# Patient Record
Sex: Male | Born: 1975 | Race: White | Hispanic: No | Marital: Single | Smoking: Current some day smoker
Health system: Southern US, Community
[De-identification: ages and names within clinical notes are randomized; demographics above are authoritative.]

---

## 2007-01-20 ENCOUNTER — Emergency Department: Payer: Self-pay | Admitting: Emergency Medicine

## 2007-09-07 ENCOUNTER — Emergency Department: Payer: Self-pay | Admitting: Emergency Medicine

## 2007-12-27 ENCOUNTER — Emergency Department: Payer: Self-pay | Admitting: Emergency Medicine

## 2008-02-09 ENCOUNTER — Emergency Department: Payer: Self-pay | Admitting: Unknown Physician Specialty

## 2018-02-09 ENCOUNTER — Emergency Department: Payer: Self-pay

## 2018-02-09 ENCOUNTER — Emergency Department
Admission: EM | Admit: 2018-02-09 | Discharge: 2018-02-09 | Disposition: A | Discharge status: Home / Self Care | Payer: Self-pay | Attending: Emergency Medicine | Admitting: Emergency Medicine

## 2018-02-09 ENCOUNTER — Other Ambulatory Visit: Payer: Self-pay

## 2018-02-09 ENCOUNTER — Encounter: Payer: Self-pay | Admitting: Emergency Medicine

## 2018-02-09 DIAGNOSIS — J102 Influenza due to other identified influenza virus with gastrointestinal manifestations: Secondary | ICD-10-CM | POA: Insufficient documentation

## 2018-02-09 DIAGNOSIS — N3000 Acute cystitis without hematuria: Secondary | ICD-10-CM | POA: Insufficient documentation

## 2018-02-09 DIAGNOSIS — F17210 Nicotine dependence, cigarettes, uncomplicated: Secondary | ICD-10-CM | POA: Insufficient documentation

## 2018-02-09 DIAGNOSIS — R05 Cough: Secondary | ICD-10-CM | POA: Insufficient documentation

## 2018-02-09 DIAGNOSIS — J101 Influenza due to other identified influenza virus with other respiratory manifestations: Secondary | ICD-10-CM

## 2018-02-09 LAB — URINALYSIS, COMPLETE (UACMP) WITH MICROSCOPIC
Bilirubin Urine: NEGATIVE
Glucose, UA: NEGATIVE mg/dL
Hgb urine dipstick: NEGATIVE
Ketones, ur: 20 mg/dL — AB
Leukocytes, UA: NEGATIVE
Nitrite: NEGATIVE
PH: 5 (ref 5.0–8.0)
Protein, ur: 100 mg/dL — AB
SPECIFIC GRAVITY, URINE: 1.03 (ref 1.005–1.030)

## 2018-02-09 LAB — LIPASE, BLOOD: LIPASE: 39 U/L (ref 11–51)

## 2018-02-09 LAB — CBC
HCT: 51.5 % (ref 40.0–52.0)
Hemoglobin: 17.9 g/dL (ref 13.0–18.0)
MCH: 30.8 pg (ref 26.0–34.0)
MCHC: 34.8 g/dL (ref 32.0–36.0)
MCV: 88.5 fL (ref 80.0–100.0)
PLATELETS: 211 10*3/uL (ref 150–440)
RBC: 5.82 MIL/uL (ref 4.40–5.90)
RDW: 12.9 % (ref 11.5–14.5)
WBC: 7.9 10*3/uL (ref 3.8–10.6)

## 2018-02-09 LAB — COMPREHENSIVE METABOLIC PANEL
ALT: 33 U/L (ref 17–63)
AST: 38 U/L (ref 15–41)
Albumin: 4.3 g/dL (ref 3.5–5.0)
Alkaline Phosphatase: 56 U/L (ref 38–126)
Anion gap: 11 (ref 5–15)
BUN: 21 mg/dL — AB (ref 6–20)
CHLORIDE: 101 mmol/L (ref 101–111)
CO2: 25 mmol/L (ref 22–32)
CREATININE: 1.17 mg/dL (ref 0.61–1.24)
Calcium: 9 mg/dL (ref 8.9–10.3)
GFR calc Af Amer: >60 mL/min (ref >60)
GFR calc non Af Amer: >60 mL/min (ref >60)
GLUCOSE: 124 mg/dL — AB (ref 65–99)
Potassium: 3.8 mmol/L (ref 3.5–5.1)
SODIUM: 137 mmol/L (ref 135–145)
Total Bilirubin: 0.6 mg/dL (ref 0.3–1.2)
Total Protein: 8.6 g/dL — ABNORMAL HIGH (ref 6.5–8.1)

## 2018-02-09 LAB — INFLUENZA PANEL BY PCR (TYPE A & B)
INFLAPCR: POSITIVE — AB
INFLBPCR: NEGATIVE

## 2018-02-09 MED ORDER — SODIUM CHLORIDE 0.9 % IV BOLUS (SEPSIS)
1000.0000 mL | Freq: Once | INTRAVENOUS | Status: AC
  Administered 2018-02-09: 1000 mL via INTRAVENOUS

## 2018-02-09 MED ORDER — IPRATROPIUM-ALBUTEROL 0.5-2.5 (3) MG/3ML IN SOLN
3.0000 mL | Freq: Once | RESPIRATORY_TRACT | Status: AC
  Administered 2018-02-09: 3 mL via RESPIRATORY_TRACT
  Filled 2018-02-09: qty 3

## 2018-02-09 MED ORDER — ACETAMINOPHEN 500 MG PO TABS
1000.0000 mg | ORAL_TABLET | Freq: Once | ORAL | Status: AC
  Administered 2018-02-09: 1000 mg via ORAL

## 2018-02-09 MED ORDER — ACETAMINOPHEN 500 MG PO TABS
ORAL_TABLET | ORAL | Status: DC
Start: 2018-02-09 — End: 2018-02-09
  Filled 2018-02-09: qty 2

## 2018-02-09 MED ORDER — SULFAMETHOXAZOLE-TRIMETHOPRIM 800-160 MG PO TABS
ORAL_TABLET | ORAL | Status: AC
  Filled 2018-02-09: qty 1

## 2018-02-09 MED ORDER — ONDANSETRON 4 MG PO TBDP: 4.0000 mg | ORAL_TABLET | Freq: Three times a day (TID) | ORAL | 0 refills | Status: DC | PRN

## 2018-02-09 MED ORDER — SULFAMETHOXAZOLE-TRIMETHOPRIM 800-160 MG PO TABS: 1.0000 | ORAL_TABLET | Freq: Two times a day (BID) | ORAL | 0 refills | Status: DC

## 2018-02-09 MED ORDER — SODIUM CHLORIDE 0.9 % IV BOLUS (SEPSIS)
1000.0000 mL | Freq: Once | INTRAVENOUS | Status: AC
  Administered 2018-02-09: 500 mL via INTRAVENOUS

## 2018-02-09 MED ORDER — SULFAMETHOXAZOLE-TRIMETHOPRIM 800-160 MG PO TABS
1.0000 | ORAL_TABLET | Freq: Once | ORAL | Status: AC
  Administered 2018-02-09: 1 via ORAL

## 2018-02-09 MED ORDER — ONDANSETRON HCL 4 MG/2ML IJ SOLN
4.0000 mg | Freq: Once | INTRAMUSCULAR | Status: AC
  Administered 2018-02-09: 4 mg via INTRAVENOUS
  Filled 2018-02-09: qty 2

## 2018-02-09 MED ORDER — PREDNISONE 20 MG PO TABS
60.0000 mg | ORAL_TABLET | Freq: Once | ORAL | Status: AC
  Administered 2018-02-09: 60 mg via ORAL
  Filled 2018-02-09: qty 3

## 2018-02-09 NOTE — ED Notes (Signed)
Clear liquids given to pt at this time, will continue to monitor

## 2018-02-09 NOTE — ED Triage Notes (Signed)
Pt states vomiting and diarrhea since Saturday, has been unable to keep anything down in a few days, appears in no distress, fever up and down per patient, last vomiting was prior to arrival this am.

## 2018-02-09 NOTE — ED Provider Notes (Signed)
[facility] Emergency Department Provider Note  ____________________________________________  Time seen: Approximately 4:21 PM  I have reviewed the triage vital signs and the nursing notes.   HISTORY  Chief Complaint Emesis and Diarrhea    HPI Geoffrey Moore is a 42 y.o. male going tobacco abuse presenting with fever, cough, nausea vomiting and diarrhea, body aches.  The patient reports that 5 days ago, he began to have 1 daily episode of nonbloody diarrhea, which resolved 2 days ago.  Simultaneously, he began to have a cough productive of thick phlegm, and 2-3 daily episodes of vomiting.  He has had fever as high as 101.0; his last fever was 2-3 days ago.  He denies any shortness of breath, lightheadedness, syncope, chest pain.  He did not get his influenza vaccination this year.  He has not had any sick contacts or travel outside the [place].  History reviewed. No pertinent past medical history.  There are no active problems to display for this patient.   History reviewed. No pertinent surgical history.    Allergies Patient has no known allergies.  No family history on file.  Social History Social History   Tobacco Use  . Smoking status: Current Some Day Smoker    Types: Cigarettes  . Smokeless tobacco: Never Used  Substance Use Topics  . Alcohol use: No    Frequency: Never  . Drug use: Not on file    Review of Systems Constitutional: Positive fever, general malaise, and myalgias. Eyes: No visual changes.  No eye discharge. ENT: No sore throat. No congestion or rhinorrhea.  No ear pain. Cardiovascular: Denies chest pain. Denies palpitations. Respiratory: Denies shortness of breath.  Positive productive cough. Gastrointestinal: No abdominal pain.  +nausea, +vomiting.  +diarrhea, now resolved.  No constipation. Genitourinary: Negative for dysuria. Musculoskeletal: Negative for back pain. Skin: Negative for rash. Neurological:  Negative for headaches. No focal numbness, tingling or weakness.     ____________________________________________   PHYSICAL EXAM:  VITAL SIGNS: ED Triage Vitals  Enc Vitals Group     BP 02/09/18 1144 127/70     Pulse Rate 02/09/18 1144 (!) 109     Resp 02/09/18 1144 18     Temp 02/09/18 1144 99.2 F (37.3 C)     Temp Source 02/09/18 1144 Oral     SpO2 02/09/18 1144 96 %     Weight 02/09/18 1145 140 lb (63.5 kg)     Height 02/09/18 1145 5\' 9"  (1.753 m)     Head Circumference --      Peak Flow --      Pain Score 02/09/18 1524 6     Pain Loc --      Pain Edu? --      Excl. in GC? --     Constitutional: Alert and oriented. Answers questions appropriately.  The patient is well-appearing and nontoxic Eyes: Conjunctivae are normal.  EOMI. No scleral icterus.  No eye discharge. Head: Atraumatic. Nose: No congestion/rhinnorhea. Mouth/Throat: Mucous membranes are moist.  Neck: No stridor.  Supple.  No JVD.  No meningismus. Cardiovascular: Fast rate, regular rhythm. No murmurs, rubs or gallops.  Respiratory: Normal respiratory effort.  No accessory muscle use or retractions.  The patient has end expiratory wheezing without rales or rhonchi.  Gastrointestinal: Soft, nontender and nondistended.  No guarding or rebound.  No peritoneal signs. Musculoskeletal: No LE edema. No ttp in the calves or palpable cords.  Negative Homan's sign. Neurologic:  A&Ox3.  Speech is clear.  Face and smile are symmetric.  EOMI.  Moves all extremities well. Skin:  Skin is warm, dry and intact. No rash noted. Psychiatric: Mood and affect are normal. Speech and behavior are normal.  Normal judgement  ____________________________________________   LABS (all labs ordered are listed, but only abnormal results are displayed)  Labs Reviewed  COMPREHENSIVE METABOLIC PANEL - Abnormal; Notable for the following components:      Result Value   Glucose, Bld 124 (*)    BUN 21 (*)    Total Protein 8.6 (*)     All other components within normal limits  URINALYSIS, COMPLETE (UACMP) WITH MICROSCOPIC - Abnormal; Notable for the following components:   Color, Urine AMBER (*)    APPearance HAZY (*)    Ketones, ur 20 (*)    Protein, ur 100 (*)    Bacteria, UA FEW (*)    Squamous Epithelial / LPF 0-5 (*)    All other components within normal limits  INFLUENZA PANEL BY PCR (TYPE A & B) - Abnormal; Notable for the following components:   Influenza A By PCR POSITIVE (*)    All other components within normal limits  URINE CULTURE  LIPASE, BLOOD  CBC   ____________________________________________  EKG  ED ECG REPORT I, Rockne Menghini, the attending physician, personally viewed and interpreted this ECG.   Date: 02/09/2018  EKG Time: 1649  Rate: 82  Rhythm: normal sinus rhythm  Axis: normal  Intervals:none  ST&T Change: No STEMI  ____________________________________________  RADIOLOGY  Dg Chest 2 View  Result Date: 02/09/2018 CLINICAL DATA:  Vomiting and diarrhea over the last 5 days. EXAM: CHEST  2 VIEW COMPARISON:  None. FINDINGS: Heart size is normal. Mediastinal shadows are normal. The lungs are clear. No bronchial thickening. No infiltrate, mass, effusion or collapse. Pulmonary vascularity is normal. No bony abnormality. IMPRESSION: Normal chest Electronically Signed   By: Paulina Fusi M.D.   On: 02/09/2018 16:37    ____________________________________________   PROCEDURES  Procedure(s) performed: None  Procedures  Critical Care performed: No ____________________________________________   INITIAL IMPRESSION / ASSESSMENT AND PLAN / ED COURSE  Pertinent labs & imaging results that were available during my care of the patient were reviewed by me and considered in my medical decision making (see chart for details).  42 y.o. smoker presenting with 5 days of nausea vomiting and diarrhea, cough, fever, and general malaise.  Overall, the patient is mildly tachycardic which  may be from dehydration.  He will receive intravenous fluids for this.  We will also get an EKG to evaluate for any evidence of arrhythmia including pericarditis although he is not having any chest pain.  Most likely etiology of the patient's symptoms is influenza or flulike illness.  His laboratory studies are reassuring with a normal white blood cell count, and a normal potassium as well as sodium.  His lipase is also normal.  We will plan to treat the patient symptomatically, get a chest x-ray to rule out pneumonia, and reevaluate him for final disposition.  ----------------------------------------- 6:48 PM on 02/09/2018 -----------------------------------------  At this time, the patient continues to be hemodynamically stable and he is feeling much better.  He is able to tolerate liquids without difficulty.  His heart rate has also normalized.  He is positive for influenza A, and also has bacteriuria with elevated white blood cells, so we will treat him for urinary tract infection.  At this time, the patient is stable for discharge home.  I have discussed close  follow-up as well as return precautions with the patient ____________________________________________  FINAL CLINICAL IMPRESSION(S) / ED DIAGNOSES  Final diagnoses:  Acute cystitis without hematuria  Influenza A         NEW MEDICATIONS STARTED DURING THIS VISIT:  New Prescriptions   ONDANSETRON (ZOFRAN ODT) 4 MG DISINTEGRATING TABLET    Take 1 tablet (4 mg total) by mouth every 8 (eight) hours as needed for nausea or vomiting.   SULFAMETHOXAZOLE-TRIMETHOPRIM (BACTRIM DS,SEPTRA DS) 800-160 MG TABLET    Take 1 tablet by mouth 2 (two) times daily.      Rockne Menghini, MD 02/09/18 1850

## 2018-02-09 NOTE — Discharge Instructions (Addendum)
Drink plenty of fluids stay well-hydrated.  You may take Tylenol or Motrin for fever pain.  Zofran is for nausea and vomiting.  Please take the entire course of antibiotics for your urinary tract infection, even if you are feeling better.  Please make an appointment with your primary care physician for follow-up; your doctor can also follow-up the results of your urine culture.  Please make sure you practice frequent and good handwashing to prevent the spread of infection.  Return to the emergency department if you develop severe pain, lightheadedness or fainting, inability to keep down fluids, or any other symptoms concerning to you.

## 2018-02-11 LAB — URINE CULTURE: CULTURE: NO GROWTH

## 2018-02-14 ENCOUNTER — Emergency Department
Admission: EM | Admit: 2018-02-14 | Discharge: 2018-02-14 | Disposition: A | Discharge status: Home / Self Care | Payer: Self-pay | Attending: Emergency Medicine | Admitting: Emergency Medicine

## 2018-02-14 ENCOUNTER — Emergency Department: Payer: Self-pay

## 2018-02-14 ENCOUNTER — Encounter: Payer: Self-pay | Admitting: Emergency Medicine

## 2018-02-14 DIAGNOSIS — J209 Acute bronchitis, unspecified: Secondary | ICD-10-CM | POA: Insufficient documentation

## 2018-02-14 DIAGNOSIS — Z7722 Contact with and (suspected) exposure to environmental tobacco smoke (acute) (chronic): Secondary | ICD-10-CM | POA: Insufficient documentation

## 2018-02-14 MED ORDER — BENZONATATE 100 MG PO CAPS: 100.0000 mg | ORAL_CAPSULE | Freq: Three times a day (TID) | ORAL | 0 refills | Status: AC | PRN

## 2018-02-14 MED ORDER — PREDNISONE 50 MG PO TABS: ORAL_TABLET | ORAL | 0 refills | Status: DC

## 2018-02-14 MED ORDER — AZITHROMYCIN 250 MG PO TABS: ORAL_TABLET | ORAL | 0 refills | Status: DC

## 2018-02-14 NOTE — ED Notes (Signed)
See triage note  Presents with cough and fever last pm  Was seen on Friday and dx'd with the flu  states he felt better couple of days ago  Then last pm sx's returned

## 2018-02-14 NOTE — ED Triage Notes (Signed)
Pt to ED with c/o of chest congestion and cough. Pt dx with flu last Friday. Pt states congestion increased and SOB.

## 2018-02-14 NOTE — ED Provider Notes (Signed)
[facility] Emergency Department Provider Note  ____________________________________________  Time seen: Approximately 3:58 PM  I have reviewed the triage vital signs and the nursing notes.   HISTORY  Chief Complaint Cough    HPI Geoffrey Moore is a 42 y.o. male presents to the emergency department with cough productive for green sputum production, shortness of breath and fatigue that started approximately 1 week after patient was diagnosed with influenza A.  He denies chest pain and chest tightness.  No prior history of asthma.  Patient reports a [place] [address] F assessed orally on Monday.  No alleviating measures have been attempted.  History reviewed. No pertinent past medical history.  There are no active problems to display for this patient.   History reviewed. No pertinent surgical history.  Prior to Admission medications   Medication Sig Start Date End Date Taking? Authorizing Provider  azithromycin (ZITHROMAX Z-PAK) 250 MG tablet Take 2 tablets the first day. Take one tablet by mouth on days 2-5. 02/14/18   Orvil FeilWoods, Jaclyn M, PA-C  benzonatate (TESSALON PERLES) 100 MG capsule Take 1 capsule (100 mg total) by mouth 3 (three) times daily as needed for up to 7 days for cough. 02/14/18 02/21/18  Orvil FeilWoods, Jaclyn M, PA-C  ondansetron (ZOFRAN ODT) 4 MG disintegrating tablet Take 1 tablet (4 mg total) by mouth every 8 (eight) hours as needed for nausea or vomiting. 02/09/18   Rockne MenghiniNorman, Anne-Caroline, MD  predniSONE (DELTASONE) 50 MG tablet Take one tablet daily for the next five days. 02/14/18   Orvil FeilWoods, Jaclyn M, PA-C  sulfamethoxazole-trimethoprim (BACTRIM DS,SEPTRA DS) 800-160 MG tablet Take 1 tablet by mouth 2 (two) times daily. 02/09/18   Rockne MenghiniNorman, Anne-Caroline, MD    Allergies Patient has no known allergies.  History reviewed. No pertinent family history.  Social History Social History   Tobacco Use  . Smoking status: Current Some Day Smoker    Types: Cigarettes   . Smokeless tobacco: Never Used  Substance Use Topics  . Alcohol use: No    Frequency: Never  . Drug use: Not on file     Review of Systems  Constitutional: Patient has fever.  Eyes: No visual changes. No discharge ENT: No upper respiratory complaints. Cardiovascular: no chest pain. Respiratory: Patient has productive cough. No SOB. Gastrointestinal: No abdominal pain.  No nausea, no vomiting.  No diarrhea.  No constipation. Musculoskeletal: Negative for musculoskeletal pain. Skin: Negative for rash, abrasions, lacerations, ecchymosis. Neurological: Negative for headaches, focal weakness or numbness.   ____________________________________________   PHYSICAL EXAM:  VITAL SIGNS: ED Triage Vitals  Enc Vitals Group     BP 02/14/18 1449 133/77     Pulse --      Resp 02/14/18 1449 16     Temp 02/14/18 1449 98.2 F (36.8 C)     Temp Source 02/14/18 1449 Oral     SpO2 02/14/18 1449 96 %     Weight --      Height --      Head Circumference --      Peak Flow --      Pain Score 02/14/18 1453 0     Pain Loc --      Pain Edu? --      Excl. in GC? --       Constitutional: Alert and oriented. Patient is lying supine. Eyes: Conjunctivae are normal. PERRL. EOMI. Head: Atraumatic. ENT:      Ears: Tympanic membranes are mildly injected with mild effusion bilaterally.  Nose: No congestion/rhinnorhea.      Mouth/Throat: Mucous membranes are moist. Posterior pharynx is mildly erythematous.  Hematological/Lymphatic/Immunilogical: No cervical lymphadenopathy.  Cardiovascular: Normal rate, regular rhythm. Normal S1 and S2.  Good peripheral circulation. Respiratory: Normal respiratory effort without tachypnea or retractions. Lungs CTAB. Good air entry to the bases with no decreased or absent breath sounds. Gastrointestinal: Bowel sounds 4 quadrants. Soft and nontender to palpation. No guarding or rigidity. No palpable masses. No distention. No CVA tenderness. Musculoskeletal:  Full range of motion to all extremities. No gross deformities appreciated. Neurologic:  Normal speech and language. No gross focal neurologic deficits are appreciated.  Skin:  Skin is warm, dry and intact. No rash noted. Psychiatric: Mood and affect are normal. Speech and behavior are normal. Patient exhibits appropriate insight and judgement.     ____________________________________________   LABS (all labs ordered are listed, but only abnormal results are displayed)  Labs Reviewed - No data to display ____________________________________________  EKG   ____________________________________________  RADIOLOGY Geraldo Pitter, personally viewed and evaluated these images (plain radiographs) as part of my medical decision making, as well as reviewing the written report by the radiologist.  Dg Chest 2 View  Result Date: 02/14/2018 CLINICAL DATA:  Chest congestion and cough.  Shortness of breath. EXAM: CHEST  2 VIEW COMPARISON:  02/09/2018 FINDINGS: The heart size and mediastinal contours are within normal limits. Both lungs are clear. The visualized skeletal structures are unremarkable. IMPRESSION: No active cardiopulmonary disease. Electronically Signed   By: Elberta Fortis M.D.   On: 02/14/2018 15:22    ____________________________________________    PROCEDURES  Procedure(s) performed:    Procedures    Medications - No data to display   ____________________________________________   INITIAL IMPRESSION / ASSESSMENT AND PLAN / ED COURSE  Pertinent labs & imaging results that were available during my care of the patient were reviewed by me and considered in my medical decision making (see chart for details).  Review of the [place] CSRS was performed in accordance of the [facility] prior to dispensing any controlled drugs.     Assessment and plan Acute bronchitis Patient presents to the emergency department with cough productive for green sputum production and shortness of  breath that has occurred for approximately 1 week.  Differential diagnosis included community-acquired pneumonia, acute bronchitis and unspecified viral URI.  Chest x-ray revealed no consolidations or findings consistent with pneumonia.  Patient was treated empirically with azithromycin and prednisone and advised to follow-up with primary care.  Vital signs are reassuring prior to discharge.  All patient questions were answered.    ____________________________________________  FINAL CLINICAL IMPRESSION(S) / ED DIAGNOSES  Final diagnoses:  Acute bronchitis, unspecified organism      NEW MEDICATIONS STARTED DURING THIS VISIT:  ED Discharge Orders        Ordered    azithromycin (ZITHROMAX Z-PAK) 250 MG tablet     02/14/18 1547    predniSONE (DELTASONE) 50 MG tablet     02/14/18 1547    benzonatate (TESSALON PERLES) 100 MG capsule  3 times daily PRN     02/14/18 1547          This chart was dictated using voice recognition software/Dragon. Despite best efforts to proofread, errors can occur which can change the meaning. Any change was purely unintentional.    Orvil Feil, PA-C 02/14/18 1601    Rockne Menghini, MD 02/28/18 2240

## 2019-01-01 IMAGING — CR DG CHEST 2V
1 series · 2 of 2 positions shown · non-contrast
Comparison: 02/09/2018

CLINICAL DATA: Chest congestion and cough.  Shortness of breath.

EXAM:
CHEST  2 VIEW

[Series 1: dg chest 2 view · 0.14mm/px · 2 of 2 slices shown]
[im 1/2]
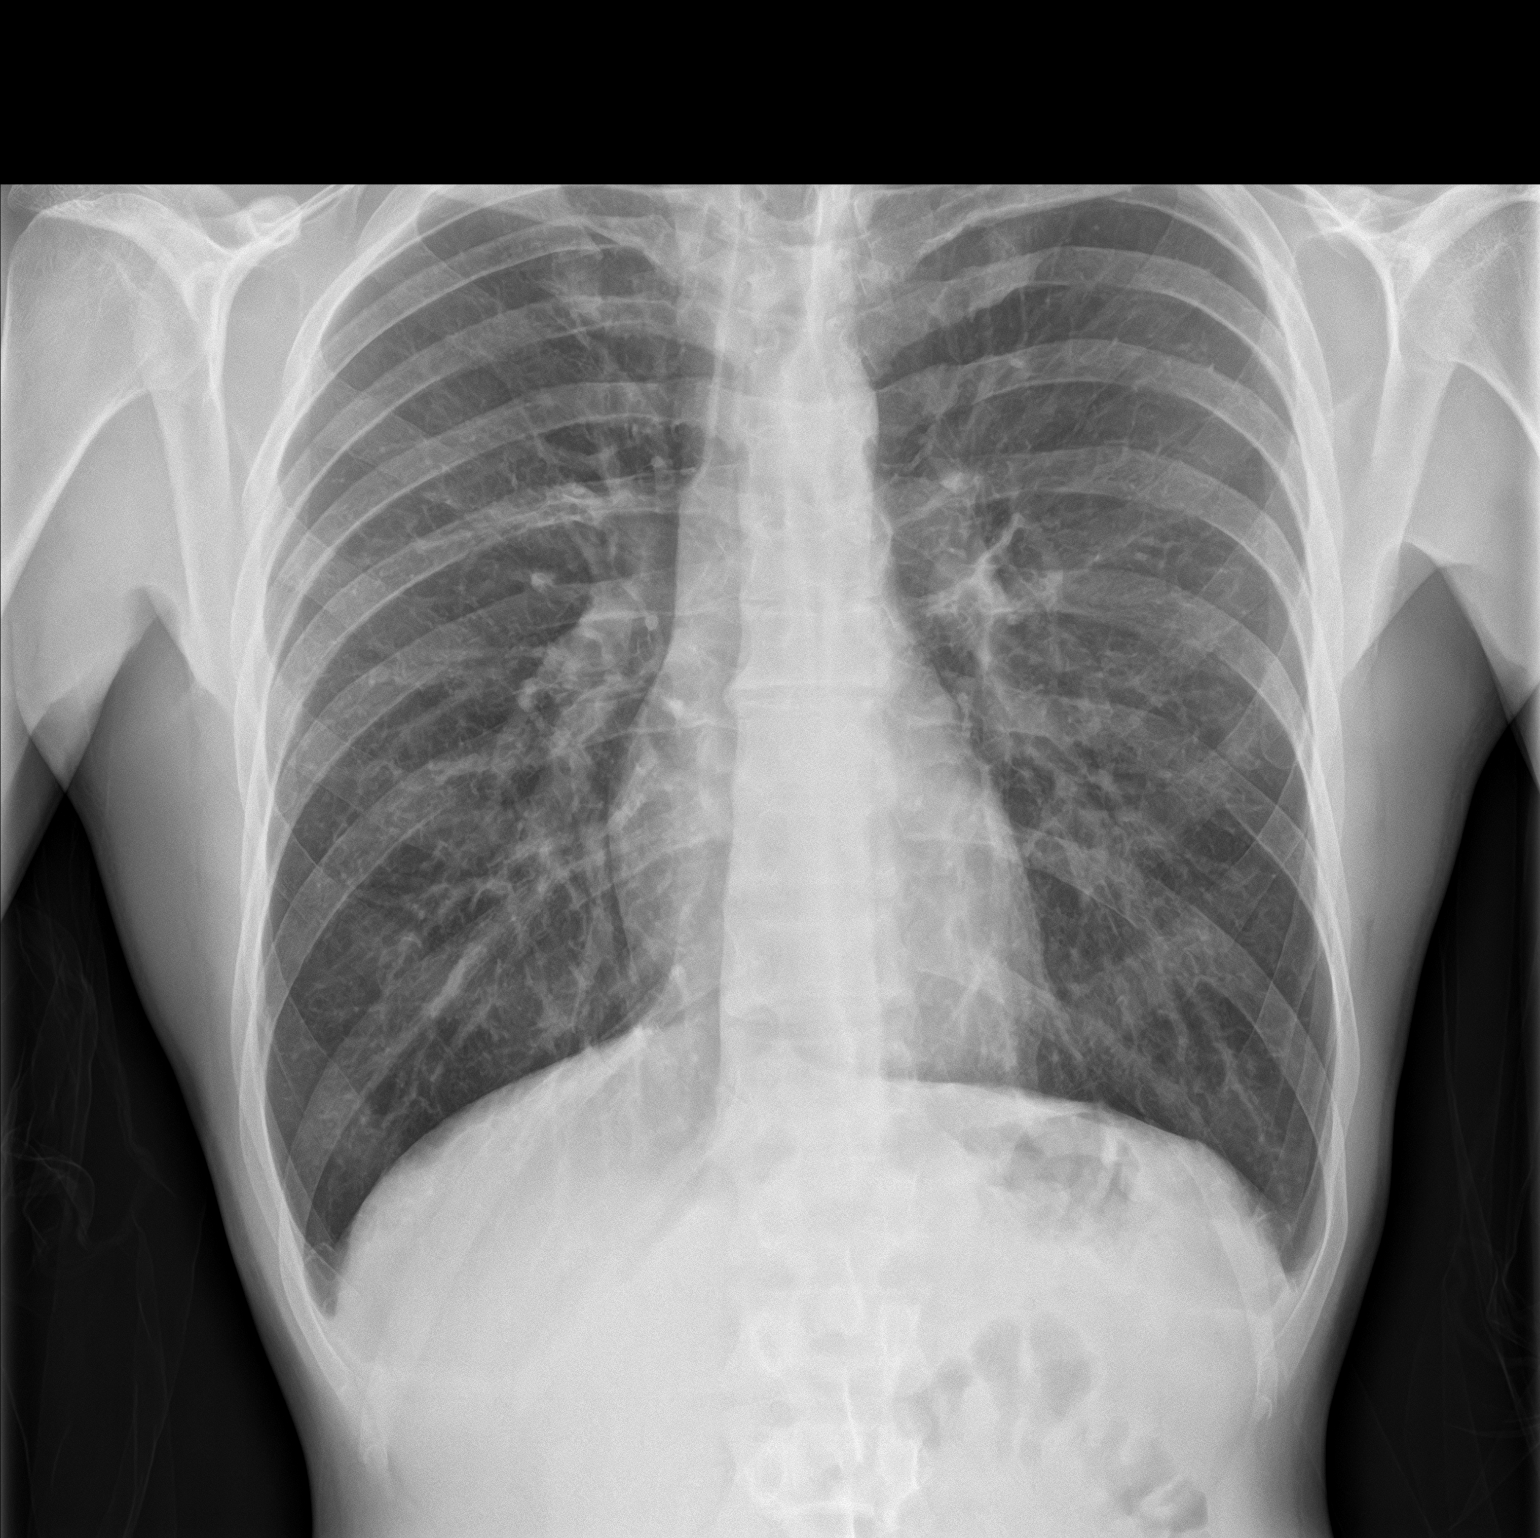
[im 2/2]
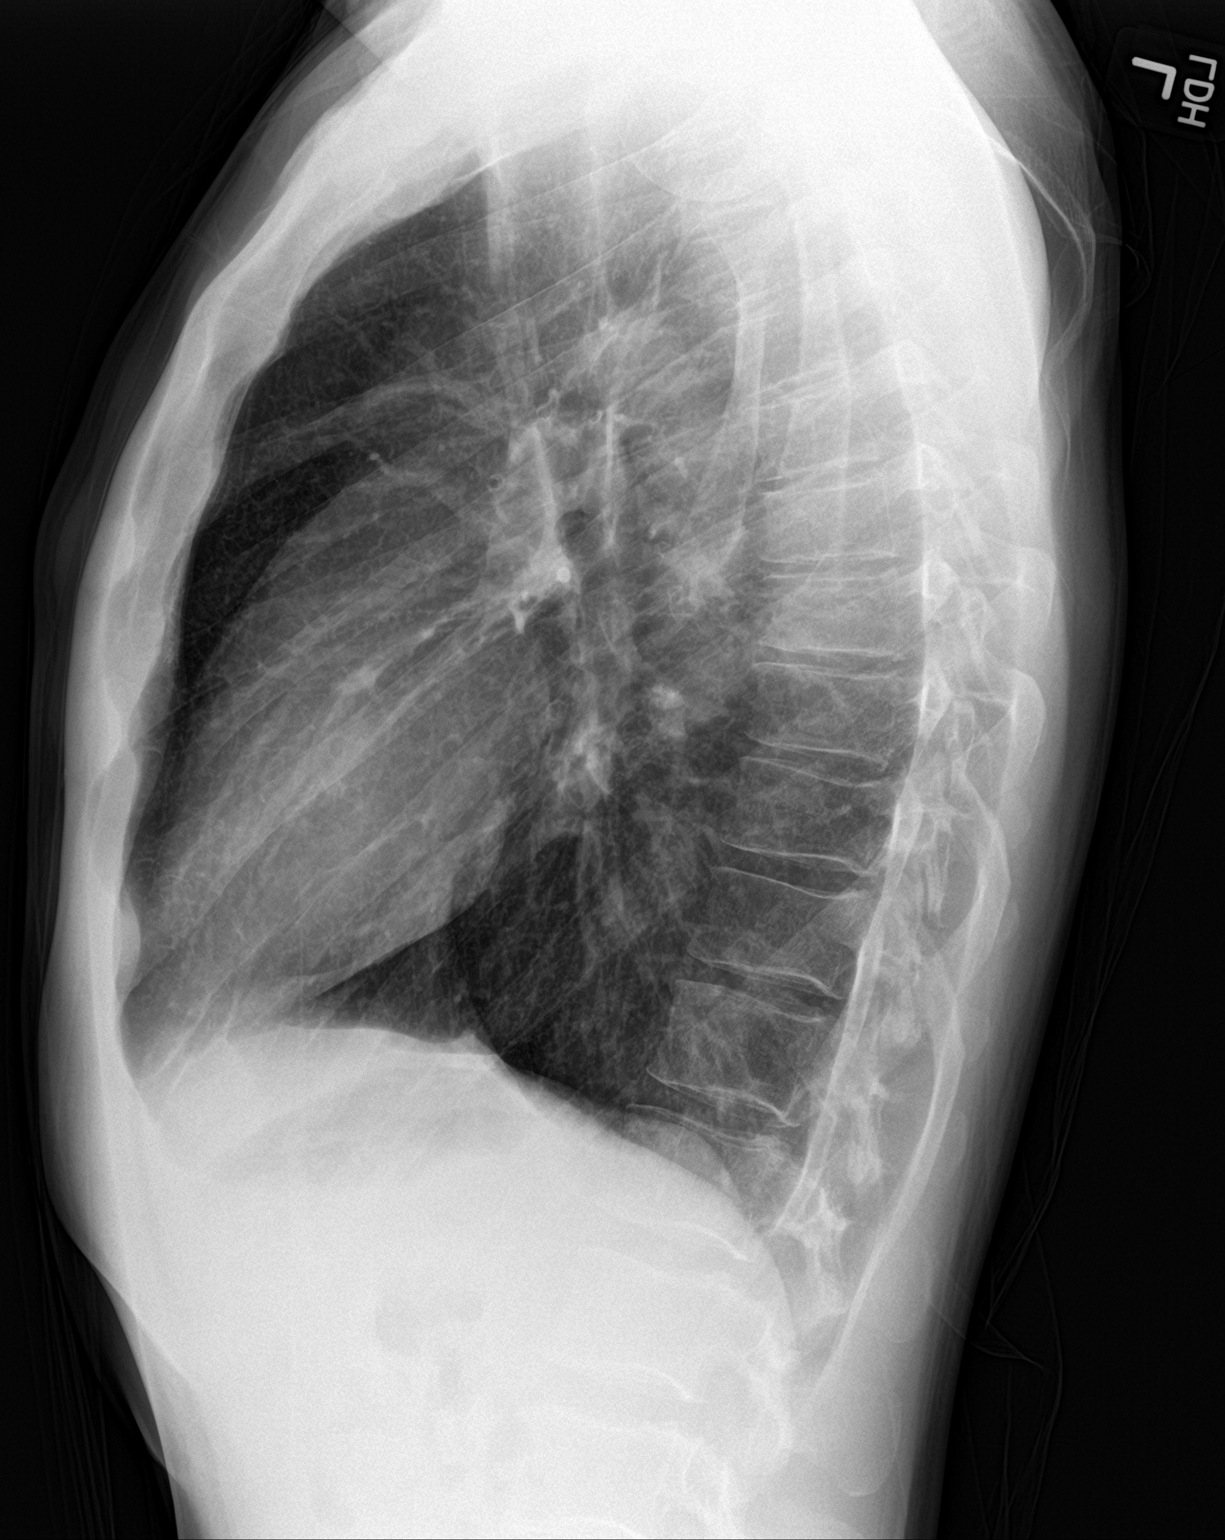

[2 of 2 positions shown; findings below may reference images not displayed]

FINDINGS: The heart size and mediastinal contours are within normal limits.
Both lungs are clear. The visualized skeletal structures are
unremarkable.
IMPRESSION: No active cardiopulmonary disease.

## 2019-01-04 ENCOUNTER — Emergency Department
Admission: EM | Admit: 2019-01-04 | Discharge: 2019-01-04 | Disposition: A | Discharge status: Home / Self Care | Payer: Self-pay | Attending: Emergency Medicine | Admitting: Emergency Medicine

## 2019-01-04 ENCOUNTER — Encounter: Payer: Self-pay | Admitting: Emergency Medicine

## 2019-01-04 ENCOUNTER — Other Ambulatory Visit: Payer: Self-pay

## 2019-01-04 DIAGNOSIS — F17210 Nicotine dependence, cigarettes, uncomplicated: Secondary | ICD-10-CM | POA: Insufficient documentation

## 2019-01-04 DIAGNOSIS — K047 Periapical abscess without sinus: Secondary | ICD-10-CM | POA: Insufficient documentation

## 2019-01-04 DIAGNOSIS — K029 Dental caries, unspecified: Secondary | ICD-10-CM | POA: Insufficient documentation

## 2019-01-04 MED ORDER — AMOXICILLIN 500 MG PO CAPS: 500.0000 mg | ORAL_CAPSULE | Freq: Three times a day (TID) | ORAL | 0 refills | Status: DC

## 2019-01-04 NOTE — Discharge Instructions (Signed)
Follow-up with 1 of the dental clinics listed on your discharge papers.  Begin taking amoxicillin 500 mg 3 times daily for 10 days.  You may also take Tylenol or ibuprofen as needed for pain.  The dental clinics listed on your discharge papers are income based and are on sliding scale.  Call for information.  Also the clinic at [facility] has walk-in hours.  OPTIONS FOR DENTAL FOLLOW UP CARE  [place] Department of Health and Human Services - Local Safety Net Dental Clinics TripDoors.comhttp://www.ncdhhs.gov/dph/oralhealth/services/safetynetclinics.htm   [facility] [phone])  [facility] [phone]  [facility] [phone])  [facility] [phone])  [facility] [phone]) This clinic caters to the indigent population and is on a lottery system. Location: [employer] [address] [facility], [address], [place] Clinic Hours: Wednesdays from 6pm - 9pm, patients seen by a lottery system. For dates, call or go to [email or web address] Services: Cleanings, fillings and simple extractions. Payment Options: DENTAL WORK IS FREE OF CHARGE. Bring proof of income or support. Best way to get seen: Arrive at 5:15 pm - this is a lottery, NOT first come/first serve, so arriving earlier will not increase your chances of being seen.     [facility] [phone] Select option 1 for emergencies   Location: [facility], [place], [address], [place] [facility] Hours: No walk-ins accepted - call the day before to schedule an appointment. Check in times are 9:30 am and 1:30 pm. Services: Simple extractions, temporary fillings, pulpectomy/pulp debridement, uncomplicated abscess drainage. Payment Options: PAYMENT IS DUE AT THE TIME OF SERVICE.  Fee is usually $100-200, additional surgical procedures (e.g. abscess drainage) may be extra. Cash,  checks, Visa/MasterCard accepted.  Can file Medicaid if patient is covered for dental - patient should call case worker to check. No discount for [facility] patients. Best way to get seen: MUST call the day before and get onto the schedule. Can usually be seen the next 1-2 days. No walk-ins accepted.     [facility] [phone]   Location: [facility], [address], [place] Clinic Hours: M, W, Th, F 8am or 1:30pm, Tues 9a or 1:30 - first come/first served. Services: Simple extractions, temporary fillings, uncomplicated abscess drainage.  You do not need to be an [place] resident. Payment Options: PAYMENT IS DUE AT THE TIME OF SERVICE. Dental insurance, otherwise sliding scale - bring proof of income or support. Depending on income and treatment needed, cost is usually $50-200. Best way to get seen: Arrive early as it is first come/first served.     [facility] [phone]   Location: [address] Clinic Hours: Mon-Thu 8a-5p Services: Most basic dental services including extractions and fillings. Payment Options: PAYMENT IS DUE AT THE TIME OF SERVICE. Sliding scale, up to 50% off - bring proof if income or support. Medicaid with dental option accepted. Best way to get seen: Call to schedule an appointment, can usually be seen within 2 weeks OR they will try to see walk-ins - show up at 8a or 2p (you may have to wait).     [facility] [phone] [place] RESIDENTS ONLY   Location: [facility], [address], [place], [place] [id number] Clinic Hours: By appointment only. Monday - Thursday 8am-5pm, Friday 8am-12pm Services: Cleanings, fillings, extractions. Payment Options: PAYMENT IS DUE AT THE TIME OF SERVICE.  Cash, Visa or MasterCard. Sliding scale - $30 minimum per service. Best way to get seen: Come in to office, complete  packet and make an appointment - need proof of income or support monies for each household member and proof of [facility] residence. Usually takes about a month to get in.     [facility] [phone]   Location: [address], [facility] Clinic Hours: Walk-in Urgent Care Dental Services are offered Monday-Friday mornings only. The numbers of emergencies accepted daily is limited to the number of providers available. Maximum 15 - Mondays, Wednesdays & Thursdays Maximum 10 - Tuesdays & Fridays Services: You do not need to be a [facility] resident to be seen for a dental emergency. Emergencies are defined as pain, swelling, abnormal bleeding, or dental trauma. Walkins will receive x-rays if needed. NOTE: Dental cleaning is not an emergency. Payment Options: PAYMENT IS DUE AT THE TIME OF SERVICE. Minimum co-pay is $40.00 for uninsured patients. Minimum co-pay is $3.00 for Medicaid with dental coverage. Dental Insurance is accepted and must be presented at time of visit. Medicare does not cover dental. Forms of payment: Cash, credit card, checks. Best way to get seen: If not previously registered with the clinic, walk-in dental registration begins at 7:15 am and is on a first come/first serve basis. If previously registered with the clinic, call to make an appointment.     [facility] [phone] [place] RESIDENTS ONLY   Location: [address] [place], [place] Clinic Hours: Mon-Thu 10a-2p Services: Extractions only! Payment Options: FREE (donations accepted) - bring proof of income or support Best way to get seen: Call and schedule an appointment OR come at 8am on the 1st Monday of every month (except for holidays) when it is first come/first served.     [facility] [phone]   Location: [address] [place], [place] Clinic Hours: Friday mornings Services, Payment Options, Best way to get seen: Call for info

## 2019-01-04 NOTE — ED Provider Notes (Signed)
[facility] Emergency Department Provider Note   ____________________________________________   First MD Initiated Contact with Patient 01/04/19 [phone]     (approximate)  I have reviewed the triage vital signs and the nursing notes.   HISTORY  Chief Complaint Dental Problem   HPI Geoffrey Moore is a 43 y.o. male presents to the ED with complaint of dental abscess for the last 2 days.  He states that he has swelling to the right upper jawline and is aware that he has multiple dental issues.  He denies any fever, chills, nausea or vomiting.  Currently rates his pain as a 10/10.   History reviewed. No pertinent past medical history.  There are no active problems to display for this patient.   History reviewed. No pertinent surgical history.  Prior to Admission medications   Medication Sig Start Date End Date Taking? Authorizing Provider  amoxicillin (AMOXIL) 500 MG capsule Take 1 capsule (500 mg total) by mouth 3 (three) times daily. 01/04/19   Tommi RumpsSummers, Xadrian Craighead L, PA-C    Allergies Patient has no known allergies.  No family history on file.  Social History Social History   Tobacco Use  . Smoking status: Current Some Day Smoker    Types: Cigarettes  . Smokeless tobacco: Never Used  Substance Use Topics  . Alcohol use: No    Frequency: Never  . Drug use: Not on file    Review of Systems Constitutional: No fever/chills Eyes: No visual changes. ENT: No sore throat.  Positive dental pain. Cardiovascular: Denies chest pain. Respiratory: Denies shortness of breath. Gastrointestinal: No abdominal pain.  No nausea, no vomiting.  Skin: Negative for rash. Neurological: Negative for headaches, focal weakness or numbness. ___________________________________________   PHYSICAL EXAM:  VITAL SIGNS: ED Triage Vitals  Enc Vitals Group     BP 01/04/19 0824 124/67     Pulse Rate 01/04/19 0824 66     Resp 01/04/19 0824 15     Temp 01/04/19 0824 98  F (36.7 C)     Temp Source 01/04/19 0824 Oral     SpO2 01/04/19 0824 98 %     Weight 01/04/19 0821 135 lb (61.2 kg)     Height 01/04/19 0821 5\' 8"  (1.727 m)     Head Circumference --      Peak Flow --      Pain Score 01/04/19 0821 10     Pain Loc --      Pain Edu? --      Excl. in GC? --    Constitutional: Alert and oriented. Well appearing and in no acute distress. Eyes: Conjunctivae are normal.  Head: Atraumatic. Nose: No congestion/rhinnorhea. Mouth/Throat: Mucous membranes are moist.  Oropharynx non-erythematous.  Right upper premolar and molar gum area is edematous.  Tender to palpation.  No active drainage is noted.  Teeth are in poor repair. Neck: No stridor.   Hematological/Lymphatic/Immunilogical: No cervical lymphadenopathy. Cardiovascular: Normal rate, regular rhythm. Grossly normal heart sounds.  Good peripheral circulation. Respiratory: Normal respiratory effort.  No retractions. Lungs CTAB. Musculoskeletal: No lower extremity tenderness nor edema.  No joint effusions. Neurologic:  Normal speech and language. No gross focal neurologic deficits are appreciated. Skin:  Skin is warm, dry and intact. No rash noted. Psychiatric: Mood and affect are normal. Speech and behavior are normal.  ____________________________________________   LABS (all labs ordered are listed, but only abnormal results are displayed)  Labs Reviewed - No data to display   PROCEDURES  Procedure(s)  performed: None  Procedures  Critical Care performed: No  ____________________________________________   INITIAL IMPRESSION / ASSESSMENT AND PLAN / ED COURSE  As part of my medical decision making, I reviewed the following data within the electronic MEDICAL RECORD NUMBER Notes from prior ED visits and [place] Controlled Substance Database  Patient presents to the ED with complaint of dental abscess for 2 days.  Patient has not had an appointment with the dentist but states that he does have a dentist.   Teeth are in poor dental repair and gums are edematous and extremely tender.  Patient was placed on amoxicillin 500 mg 3 times daily for 10 days.  He is to continue taking Tylenol or ibuprofen as needed for pain.  He was given a list of dental clinics just in case he is unable to get in his dental office.  ____________________________________________   FINAL CLINICAL IMPRESSION(S) / ED DIAGNOSES  Final diagnoses:  Dental abscess  Dental caries     ED Discharge Orders         Ordered    amoxicillin (AMOXIL) 500 MG capsule  3 times daily     01/04/19 3570           Note:  This document was prepared using Dragon voice recognition software and may include unintentional dictation errors.    Tommi Rumps, PA-C 01/04/19 1556    Arnaldo Natal, MD 01/04/19 [phone]

## 2019-01-04 NOTE — ED Triage Notes (Signed)
Dental abscess , x2 days , swelling to right upper jaw line

## 2019-01-04 NOTE — ED Notes (Signed)
Pt alert and oriented X4, active, cooperative, pt in NAD. RR even and unlabored, color WNL.  Pt informed to return if any life threatening symptoms occur.  Discharge and followup instructions reviewed. Ambulates safely. All of belongings taken with patient.

## 2019-02-23 ENCOUNTER — Emergency Department
Admission: EM | Admit: 2019-02-23 | Discharge: 2019-02-23 | Disposition: A | Discharge status: Home / Self Care | Payer: Self-pay | Attending: Emergency Medicine | Admitting: Emergency Medicine

## 2019-02-23 ENCOUNTER — Other Ambulatory Visit: Payer: Self-pay

## 2019-02-23 ENCOUNTER — Encounter: Payer: Self-pay | Admitting: Emergency Medicine

## 2019-02-23 DIAGNOSIS — F17210 Nicotine dependence, cigarettes, uncomplicated: Secondary | ICD-10-CM | POA: Insufficient documentation

## 2019-02-23 DIAGNOSIS — K047 Periapical abscess without sinus: Secondary | ICD-10-CM | POA: Insufficient documentation

## 2019-02-23 DIAGNOSIS — R220 Localized swelling, mass and lump, head: Secondary | ICD-10-CM

## 2019-02-23 DIAGNOSIS — R600 Localized edema: Secondary | ICD-10-CM | POA: Insufficient documentation

## 2019-02-23 LAB — CBC WITH DIFFERENTIAL/PLATELET
Abs Immature Granulocytes: 0.05 10*3/uL (ref 0.00–0.07)
BASOS PCT: 1 %
Basophils Absolute: 0.1 10*3/uL (ref 0.0–0.1)
EOS ABS: 0.1 10*3/uL (ref 0.0–0.5)
EOS PCT: 1 %
HEMATOCRIT: 44.2 % (ref 39.0–52.0)
Hemoglobin: 15.1 g/dL (ref 13.0–17.0)
Immature Granulocytes: 0 %
LYMPHS ABS: 1.4 10*3/uL (ref 0.7–4.0)
Lymphocytes Relative: 10 %
MCH: 30.8 pg (ref 26.0–34.0)
MCHC: 34.2 g/dL (ref 30.0–36.0)
MCV: 90 fL (ref 80.0–100.0)
MONO ABS: 1.9 10*3/uL — AB (ref 0.1–1.0)
MONOS PCT: 13 %
Neutro Abs: 11.2 10*3/uL — ABNORMAL HIGH (ref 1.7–7.7)
Neutrophils Relative %: 75 %
PLATELETS: 339 10*3/uL (ref 150–400)
RBC: 4.91 MIL/uL (ref 4.22–5.81)
RDW: 13 % (ref 11.5–15.5)
WBC: 14.6 10*3/uL — AB (ref 4.0–10.5)
nRBC: 0 % (ref 0.0–0.2)

## 2019-02-23 MED ORDER — LIDOCAINE-EPINEPHRINE 2 %-1:100000 IJ SOLN
1.7000 mL | Freq: Once | INTRAMUSCULAR | Status: AC
  Administered 2019-02-23: 1.7 mL
  Filled 2019-02-23: qty 1.7

## 2019-02-23 MED ORDER — KETOROLAC TROMETHAMINE 30 MG/ML IJ SOLN
30.0000 mg | Freq: Once | INTRAMUSCULAR | Status: AC
  Administered 2019-02-23: 30 mg via INTRAVENOUS
  Filled 2019-02-23: qty 1

## 2019-02-23 MED ORDER — AMOXICILLIN 500 MG PO CAPS: 500.0000 mg | ORAL_CAPSULE | Freq: Three times a day (TID) | ORAL | 0 refills | Status: AC

## 2019-02-23 MED ORDER — SODIUM CHLORIDE 0.9 % IV SOLN
3.0000 g | Freq: Once | INTRAVENOUS | Status: AC
  Administered 2019-02-23: 3 g via INTRAVENOUS
  Filled 2019-02-23: qty 3

## 2019-02-23 NOTE — ED Notes (Signed)
See triage note  Presents with redness and swelling to right side of face  States he had a tooth pulled about 1 month ago  Now has another tooth that is bothering him  Pain became worse this am

## 2019-02-23 NOTE — ED Triage Notes (Signed)
Patient complaining of right sided upper jaw tooth ache, with facial swelling that started yesterday.  Right side of face is visibly swollen.

## 2019-02-23 NOTE — Discharge Instructions (Addendum)
You been treated for a dental abscess.  Take antibiotic as directed until all pills are completed.  Continue to rinse mouth with warm salty water and apply compress to the face to reduce swelling.  Follow-up with [facility] or one of the dental clinics listed below.  Return to the ED for acutely worsening symptoms including fevers, sweats, or increased facial swelling.  OPTIONS FOR DENTAL FOLLOW UP CARE   Department of Health and Human Services - Local Safety Net Dental Clinics [email or web address]   [facility] [phone])  [facility] [phone])  [place] [phone])  [facility] [phone])  [facility] [phone]) This clinic caters to the indigent population and is on a lottery system. Location: [employer] [facility], [employer], [address], [place] Clinic Hours: Wednesdays from 6pm - 9pm, patients seen by a lottery system. For dates, call or go to [email or web address] Services: Cleanings, fillings and simple extractions. Payment Options: DENTAL WORK IS FREE OF CHARGE. Bring proof of income or support. Best way to get seen: Arrive at 5:15 pm - this is a lottery, NOT first come/first serve, so arriving earlier will not increase your chances of being seen.     [facility] [phone] Select option 1 for emergencies   Location: [facility], [place], [address] [place] Clinic Hours: No walk-ins accepted - call the day before to schedule an appointment. Check in times are 9:30 am and 1:30 pm. Services: Simple extractions, temporary fillings, pulpectomy/pulp debridement, uncomplicated abscess drainage. Payment Options: PAYMENT IS DUE AT THE TIME OF SERVICE.  Fee is usually $100-200, additional surgical procedures (e.g. abscess drainage) may be  extra. Cash, checks, Visa/MasterCard accepted.  Can file Medicaid if patient is covered for dental - patient should call case worker to check. No discount for [facility] patients. Best way to get seen: MUST call the day before and get onto the schedule. Can usually be seen the next 1-2 days. No walk-ins accepted.     [facility] [phone]   Location: [facility], [address], [place] Clinic Hours: M, W, Th, F 8am or 1:30pm, Tues 9a or 1:30 - first come/first served. Services: Simple extractions, temporary fillings, uncomplicated abscess drainage.  You do not need to be an [facility] resident. Payment Options: PAYMENT IS DUE AT THE TIME OF SERVICE. Dental insurance, otherwise sliding scale - bring proof of income or support. Depending on income and treatment needed, cost is usually $50-200. Best way to get seen: Arrive early as it is first come/first served.     [facility] [phone]   Location: [address] Clinic Hours: Mon-Thu 8a-5p Services: Most basic dental services including extractions and fillings. Payment Options: PAYMENT IS DUE AT THE TIME OF SERVICE. Sliding scale, up to 50% off - bring proof if income or support. Medicaid with dental option accepted. Best way to get seen: Call to schedule an appointment, can usually be seen within 2 weeks OR they will try to see walk-ins - show up at 8a or 2p (you may have to wait).     [facility] [phone] [place] RESIDENTS ONLY   Location: [facility], [address] [place], [place] [address] Clinic Hours: By appointment only. Monday - Thursday 8am-5pm, Friday 8am-12pm Services: Cleanings, fillings, extractions. Payment Options: PAYMENT IS DUE AT THE TIME OF SERVICE. Cash, [place] or  MasterCard. Sliding scale - $30 minimum per service. Best way to get seen: Come in to office,  complete packet and make an appointment - need proof of income or support monies for each household member and proof of [facility] residence. Usually takes about a month to get in.     [facility] [phone]   Location: [address], [facility] Clinic Hours: Walk-in Urgent Care Dental Services are offered Monday-Friday mornings only. The numbers of emergencies accepted daily is limited to the number of providers available. Maximum 15 - Mondays, Wednesdays & Thursdays Maximum 10 - Tuesdays & Fridays Services: You do not need to be a [facility] resident to be seen for a dental emergency. Emergencies are defined as pain, swelling, abnormal bleeding, or dental trauma. Walkins will receive x-rays if needed. NOTE: Dental cleaning is not an emergency. Payment Options: PAYMENT IS DUE AT THE TIME OF SERVICE. Minimum co-pay is $40.00 for uninsured patients. Minimum co-pay is $3.00 for Medicaid with dental coverage. Dental Insurance is accepted and must be presented at time of visit. Medicare does not cover dental. Forms of payment: Cash, credit card, checks. Best way to get seen: If not previously registered with the clinic, walk-in dental registration begins at 7:15 am and is on a first come/first serve basis. If previously registered with the clinic, call to make an appointment.     [facility] [phone] [place] RESIDENTS ONLY   Location: [address], [place], [place] Clinic Hours: Mon-Thu 10a-2p Services: Extractions only! Payment Options: FREE (donations accepted) - bring proof of income or support Best way to get seen: Call and schedule an appointment OR come at 8am on the 1st Monday of every month (except for holidays) when it is first come/first served.     [facility] [phone]   Location: [address] [place], [place] Clinic Hours: Friday mornings Services, Payment Options, Best way to get seen: Call for  info

## 2019-02-23 NOTE — ED Provider Notes (Signed)
[facility] Emergency Department Provider Note ____________________________________________  Time seen: 1107  I have reviewed the triage vital signs and the nursing notes.  HISTORY  Chief Complaint  Dental Pain and Facial Swelling  HPI Geoffrey Moore is a 43 y.o. male presents himself to the ED for evaluation of facial swelling that began yesterday.  Patient describes increased swelling to the right side of the face with extension towards the eye since last night.  He admits to poor dentition and and has some swelling to the upper right premolar.  He denies any difficulty breathing, swallowing, or controlling secretions.  He also denies any interim fevers.  He said previous episodes of dental abscess including facial swelling, and has been treated previously with antibiotics.  He denies any chest pain, shortness of breath, or vomiting.  History reviewed. No pertinent past medical history.  There are no active problems to display for this patient.  History reviewed. No pertinent surgical history.  Prior to Admission medications   Medication Sig Start Date End Date Taking? Authorizing Provider  amoxicillin (AMOXIL) 500 MG capsule Take 1 capsule (500 mg total) by mouth 3 (three) times daily. 02/23/19   Mallie Giambra, Charlesetta Ivory, PA-C   Allergies Patient has no known allergies.  No family history on file.  Social History Social History   Tobacco Use  . Smoking status: Current Some Day Smoker    Types: Cigarettes  . Smokeless tobacco: Never Used  Substance Use Topics  . Alcohol use: No    Frequency: Never  . Drug use: Not on file    Review of Systems  Constitutional: Negative for fever. Eyes: Negative for visual changes. ENT: Negative for sore throat.  Dental pain and facial swelling as above. Cardiovascular: Negative for chest pain. Respiratory: Negative for shortness of breath. Gastrointestinal: Negative for abdominal pain, vomiting and  diarrhea. Genitourinary: Negative for dysuria. Musculoskeletal: Negative for back pain. Skin: Negative for rash. Neurological: Negative for headaches, focal weakness or numbness. ____________________________________________  PHYSICAL EXAM:  VITAL SIGNS: ED Triage Vitals [02/23/19 0951]  Enc Vitals Group     BP 129/75     Pulse Rate 70     Resp 16     Temp 98.6 F (37 C)     Temp Source Oral     SpO2 98 %     Weight 150 lb (68 kg)     Height 5\' 8"  (1.727 m)     Head Circumference      Peak Flow      Pain Score 10     Pain Loc      Pain Edu?      Excl. in GC?     Constitutional: Alert and oriented. Well appearing and in no distress. Head: Normocephalic and atraumatic.  Subtle soft tissue facial swelling noted lower jawline. Eyes: Conjunctivae are normal. PERRL. Normal extraocular movements Ears: Canals clear. TMs intact bilaterally. Nose: No congestion/rhinorrhea/epistaxis. Mouth/Throat: Mucous membranes are moist.  Uvula is midline and tonsils are flat.  No oropharyngeal lesions appreciated.  Patient with focal pointing and spontaneous purulence from the upper buccal mucosa near the right premolar.  No brawny sublingual edema is noted. Neck: Supple. No thyromegaly. Hematological/Lymphatic/Immunological: No cervical lymphadenopathy. Cardiovascular: Normal rate, regular rhythm. Normal distal pulses. Respiratory: Normal respiratory effort. No wheezes/rales/rhonchi. ____________________________________________   LABS (pertinent positives/negatives) Labs Reviewed  CBC WITH DIFFERENTIAL/PLATELET - Abnormal; Notable for the following components:      Result Value   WBC 14.6 (*)  Neutro Abs 11.2 (*)    Monocytes Absolute 1.9 (*)    All other components within normal limits  ____________________________________________  PROCEDURES Ampicillin-sulbactam 3 g IVPB Toradol 30 mg IVP  .Marland KitchenIncision and Drainage Date/Time: 02/23/2019 1:12 PM Performed by: Lissa Hoard, PA-C Authorized by: Lissa Hoard, PA-C   Consent:    Consent obtained:  Verbal   Consent given by:  Patient   Risks discussed:  Bleeding and pain   Alternatives discussed:  No treatment Location:    Type:  Abscess   Location:  Mouth   Mouth location: buccal mucosa. Anesthesia (see MAR for exact dosages):    Anesthesia method:  Local infiltration   Local anesthetic:  Lidocaine 2% w/o epi Procedure type:    Complexity:  Simple Procedure details:    Needle aspiration: yes     Needle size:  25 G   Incision depth:  Dermal   Drainage:  Purulent   Drainage amount:  Moderate   Packing materials:  None Post-procedure details:    Patient tolerance of procedure:  Tolerated well, no immediate complications  ____________________________________________  INITIAL IMPRESSION / ASSESSMENT AND PLAN / ED COURSE  DDX includes: cellulitis, dental abscess, parotiditis  Patient with ED evaluation and management of a subtle focal dental abscess with facial swelling.  He presents to the ED for management of his symptoms that developed quickly overnight.  Patient has not had any interim fevers, chills, sweats; and has had no difficulty controlling oral secretions.  He was found to have an elevated white count and was given an initial dose Unasyn for his dental abscess.  The focal abscess was drained spontaneously after local anesthesia was infiltrated.  Patient reports relief of his symptoms as well as decreased facial swelling and tightness.  He is discharged with a prescription for amoxicillin to take as directed.  Return precautions have been reviewed.  Patient is referred to a dental clinic for definitive management. ____________________________________________  FINAL CLINICAL IMPRESSION(S) / ED DIAGNOSES  Final diagnoses:  Dental abscess  Facial swelling      Karmen Stabs, Charlesetta Ivory, PA-C 02/23/19 1316    Jeanmarie Plant, MD 02/23/19 1524
